# Patient Record
Sex: Female | Born: 1960 | Race: White | Hispanic: No | Marital: Single | Smoking: Never smoker
Health system: Southern US, Community
[De-identification: ages and names within clinical notes are randomized; demographics above are authoritative.]

## PROBLEM LIST (undated history)

## (undated) HISTORY — PX: TUBAL LIGATION: SHX77

## (undated) HISTORY — PX: OTHER SURGICAL HISTORY: SHX169

---

## 2013-01-11 ENCOUNTER — Ambulatory Visit: Payer: Self-pay | Admitting: Obstetrics and Gynecology

## 2013-01-11 LAB — CBC WITH DIFFERENTIAL/PLATELET
Basophil #: 0 10*3/uL (ref 0.0–0.1)
Eosinophil #: 0 10*3/uL (ref 0.0–0.7)
Eosinophil %: 0.1 %
HCT: 40.6 % (ref 35.0–47.0)
Neutrophil #: 10.5 10*3/uL — ABNORMAL HIGH (ref 1.4–6.5)
Neutrophil %: 88 %
Platelet: 191 10*3/uL (ref 150–440)
RBC: 4.56 10*6/uL (ref 3.80–5.20)

## 2013-01-15 LAB — PATHOLOGY REPORT

## 2013-01-23 ENCOUNTER — Ambulatory Visit: Payer: Self-pay | Admitting: Family Medicine

## 2013-02-11 ENCOUNTER — Ambulatory Visit: Payer: Self-pay | Admitting: Family Medicine

## 2013-12-27 ENCOUNTER — Ambulatory Visit: Payer: Self-pay

## 2014-05-16 NOTE — Op Note (Signed)
PATIENT NAMCaryl Bishop:  Ann Bishop, Ann Bishop MR#:  [id number] DATE OF BIRTH:  Jun 10, 1960  DATE OF PROCEDURE:  01/11/2013  PREOPERATIVE DIAGNOSIS:  Prolapsing polyp.   POSTOPERATIVE DIAGNOSIS:  Prolapsing fibroid.   OPERATION PERFORMED:  Operative hysteroscopy and resection of a prolapsing submucosal fibroid.    ANESTHESIA USED:  General via LMA airway.   PRIMARY SURGEON:  Dr. Vena AustriaAndreas Isidro Monks.    PREOPERATIVE ANTIBIOTICS:  None.   DRAINS OR TUBES:  None.   IMPLANTS:  None.   ESTIMATED BLOOD LOSS:  25 mL.   OPERATIVE FLUIDS:  900 mL of crystalloid.   URINE OUTPUT:  150 mL of clear urine.   GLYCINE DEFICIT:  60 mL.   COMPLICATIONS:  None.   INTRAOPERATIVE FINDINGS:  A 2 cm prolapsing fibroid noted at the external cervical os.  The stalk was unable to be twisted off so the fibroid was resected off the stalk using Mayo scissors.  The remainder of the stalk was then removed using a combination of sharp curettage and the resectoscope.   SPECIMENS REMOVED:  Endometrial curettings and prolapsing leiomyomata.    THE PATIENT CONDITION FOLLOWING PROCEDURE:  Stable.   PROCEDURE IN DETAIL:  Risks, benefits and alternatives of the procedure were discussed with the patient prior to proceeding to the Operating Room.  The patient had been seen by her primary care earlier today for a routine annual exam at which time the prolapsing fibroid had been noted.  Attempt had been made to remove the fibroid in the office, however this resulted in bleeding.  The patient was seen in clinic by Dr. Luella Cookosenow and referred for surgical management.  The patient was taken to the operating room, was placed under general endotracheal anesthesia.  She was positioned in the dorsal lithotomy position.  Attention was turned to the patient's pelvic.  The bladder was traight cathed with a red rubber catheter.  An operative speculum was placed and the anterior lip of the cervix was grasped with a single-tooth tenaculum.  A large prolapsing  fibroid was noted at the os.  This appeared slightly necrotic.  Attempts to remove the fibroid by grasping it with a ring forceps and twisting were unsuccessful.  The fibroid was therefore pulled as far out of the cervical os as possible and the stalk resected using Mayo scissors.  Following this, a sharp curettage was performed. Hysteroscopy at the conclusion of the dilation and curettage revealed a portion of the stalk was still noted at the fundal portion of the uterus.  This was resected using the resectoscope with excellent restoration of normal uterine confluent anatomy.  The resection site was hemostatic.  The resectoscope was removed.  Sponge, needle and instrument counts were correct x 2.  The patient tolerated the procedure well and was taken to the recovery room in stable condition.     ____________________________ Florina OuAndreas M. Bonney AidStaebler, MD ams:ea D: 01/11/2013 22:38:00 ET T: 01/12/2013 06:34:59 ET JOB#: [id number]  cc: Florina OuAndreas M. Bonney AidStaebler, MD, <Dictator> Lorrene ReidANDREAS M Kella Splinter MD ELECTRONICALLY SIGNED 01/13/2013 19:08

## 2014-05-16 NOTE — Op Note (Signed)
PATIENT NAME:  Ann Bishop, Ann Bishop MR#:  [id number] DATE OF BIRTH:  12/02/1960  DATE OF PROCEDURE:  01/11/2013  PREOPERATIVE DIAGNOSIS:  Prolapsing polyp.   POSTOPERATIVE DIAGNOSIS:  Prolapsing fibroid.   OPERATION PERFORMED:  Operative hysteroscopy and resection of a prolapsing submucosal fibroid.    ANESTHESIA USED:  General via LMA airway.   PRIMARY SURGEON:  Dr. Steed Kanaan.    PREOPERATIVE ANTIBIOTICS:  None.   DRAINS OR TUBES:  None.   IMPLANTS:  None.   ESTIMATED BLOOD LOSS:  25 mL.   OPERATIVE FLUIDS:  900 mL of crystalloid.   URINE OUTPUT:  150 mL of clear urine.   GLYCINE DEFICIT:  60 mL.   COMPLICATIONS:  None.   INTRAOPERATIVE FINDINGS:  A 2 cm prolapsing fibroid noted at the external cervical os.  The stalk was unable to be twisted off so the fibroid was resected off the stalk using Mayo scissors.  The remainder of the stalk was then removed using a combination of sharp curettage and the resectoscope.   SPECIMENS REMOVED:  Endometrial curettings and prolapsing leiomyomata.    THE PATIENT CONDITION FOLLOWING PROCEDURE:  Stable.   PROCEDURE IN DETAIL:  Risks, benefits and alternatives of the procedure were discussed with the patient prior to proceeding to the Operating Room.  The patient had been seen by her primary care earlier today for a routine annual exam at which time the prolapsing fibroid had been noted.  Attempt had been made to remove the fibroid in the office, however this resulted in bleeding.  The patient was seen in clinic by Dr. Rosenow and referred for surgical management.  The patient was taken to the operating room, was placed under general endotracheal anesthesia.  She was positioned in the dorsal lithotomy position.  Attention was turned to the patient's pelvic.  The bladder was traight cathed with a red rubber catheter.  An operative speculum was placed and the anterior lip of the cervix was grasped with a single-tooth tenaculum.  A large prolapsing  fibroid was noted at the os.  This appeared slightly necrotic.  Attempts to remove the fibroid by grasping it with a ring forceps and twisting were unsuccessful.  The fibroid was therefore pulled as far out of the cervical os as possible and the stalk resected using Mayo scissors.  Following this, a sharp curettage was performed. Hysteroscopy at the conclusion of the dilation and curettage revealed a portion of the stalk was still noted at the fundal portion of the uterus.  This was resected using the resectoscope with excellent restoration of normal uterine confluent anatomy.  The resection site was hemostatic.  The resectoscope was removed.  Sponge, needle and instrument counts were correct x 2.  The patient tolerated the procedure well and was taken to the recovery room in stable condition.     ____________________________ Tanuj Mullens M. Dashea Mcmullan, MD ams:ea D: 01/11/2013 22:38:00 ET T: 01/12/2013 06:34:59 ET JOB#: [id number]  cc: Keisa Blow M. Zahirah Cheslock, MD, <Dictator> Bracen Schum M Aleck Locklin MD ELECTRONICALLY SIGNED 01/13/2013 19:08 

## 2014-12-23 ENCOUNTER — Encounter: Payer: Self-pay | Admitting: Internal Medicine

## 2014-12-23 DIAGNOSIS — D539 Nutritional anemia, unspecified: Secondary | ICD-10-CM | POA: Insufficient documentation

## 2015-06-07 IMAGING — MG MAM DGTL SCRN MAM NO ORDER W/CAD
1 series · 4 of 4 positions shown · non-contrast
Comparison: Previous exam(s)

CLINICAL DATA: Screening.

EXAM:
DIGITAL SCREENING BILATERAL MAMMOGRAM WITH CAD

[R CC · right · 4 of 4 slices shown]
[im 1/4]
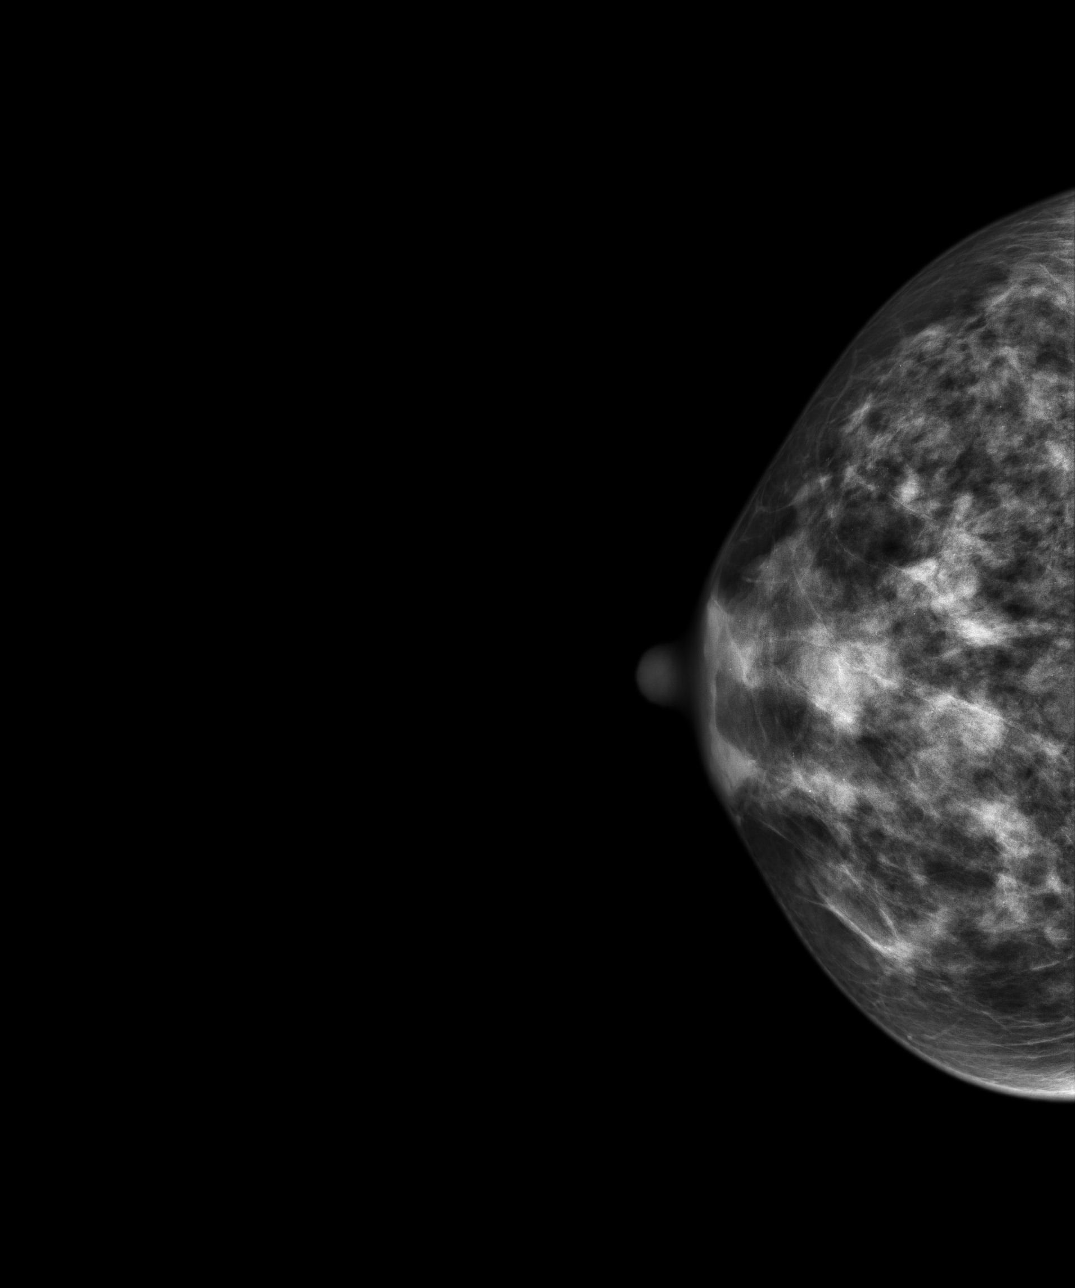
[im 2/4]
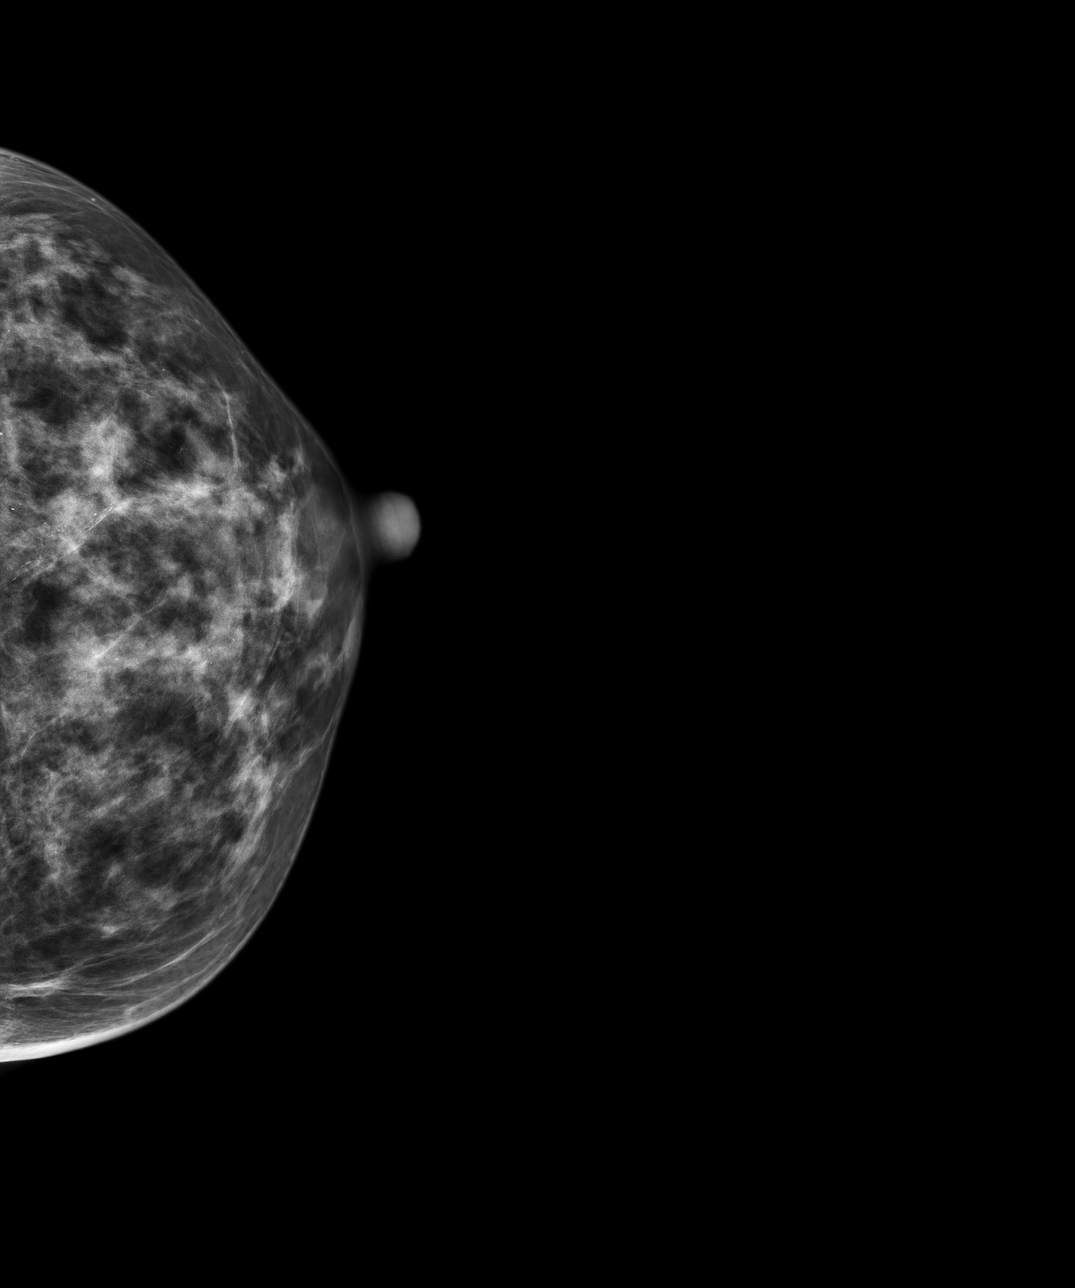
[im 3/4]
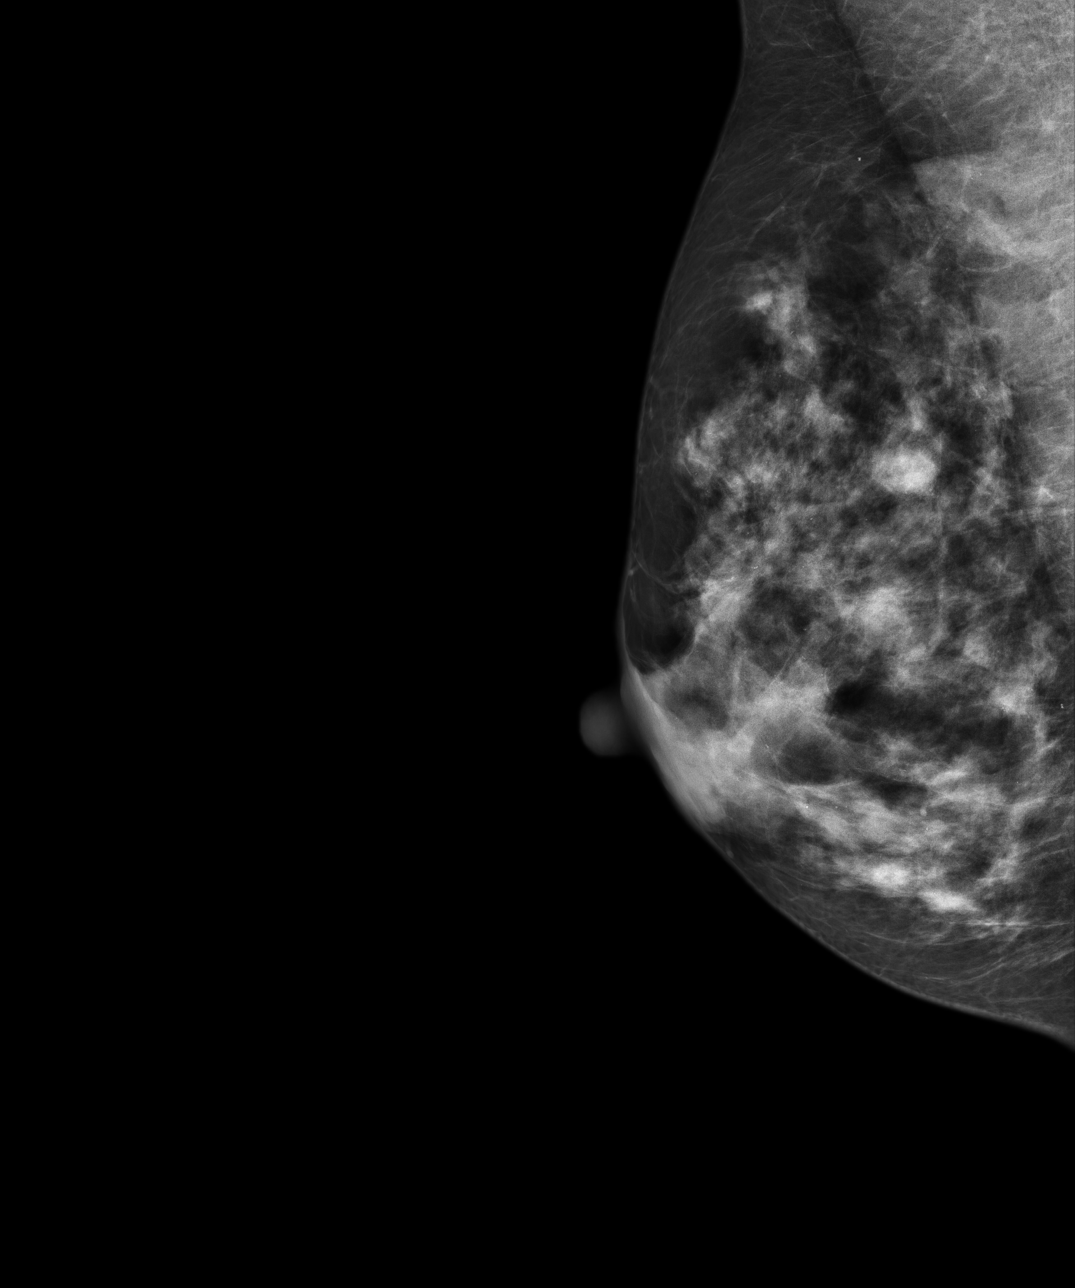
[im 4/4]
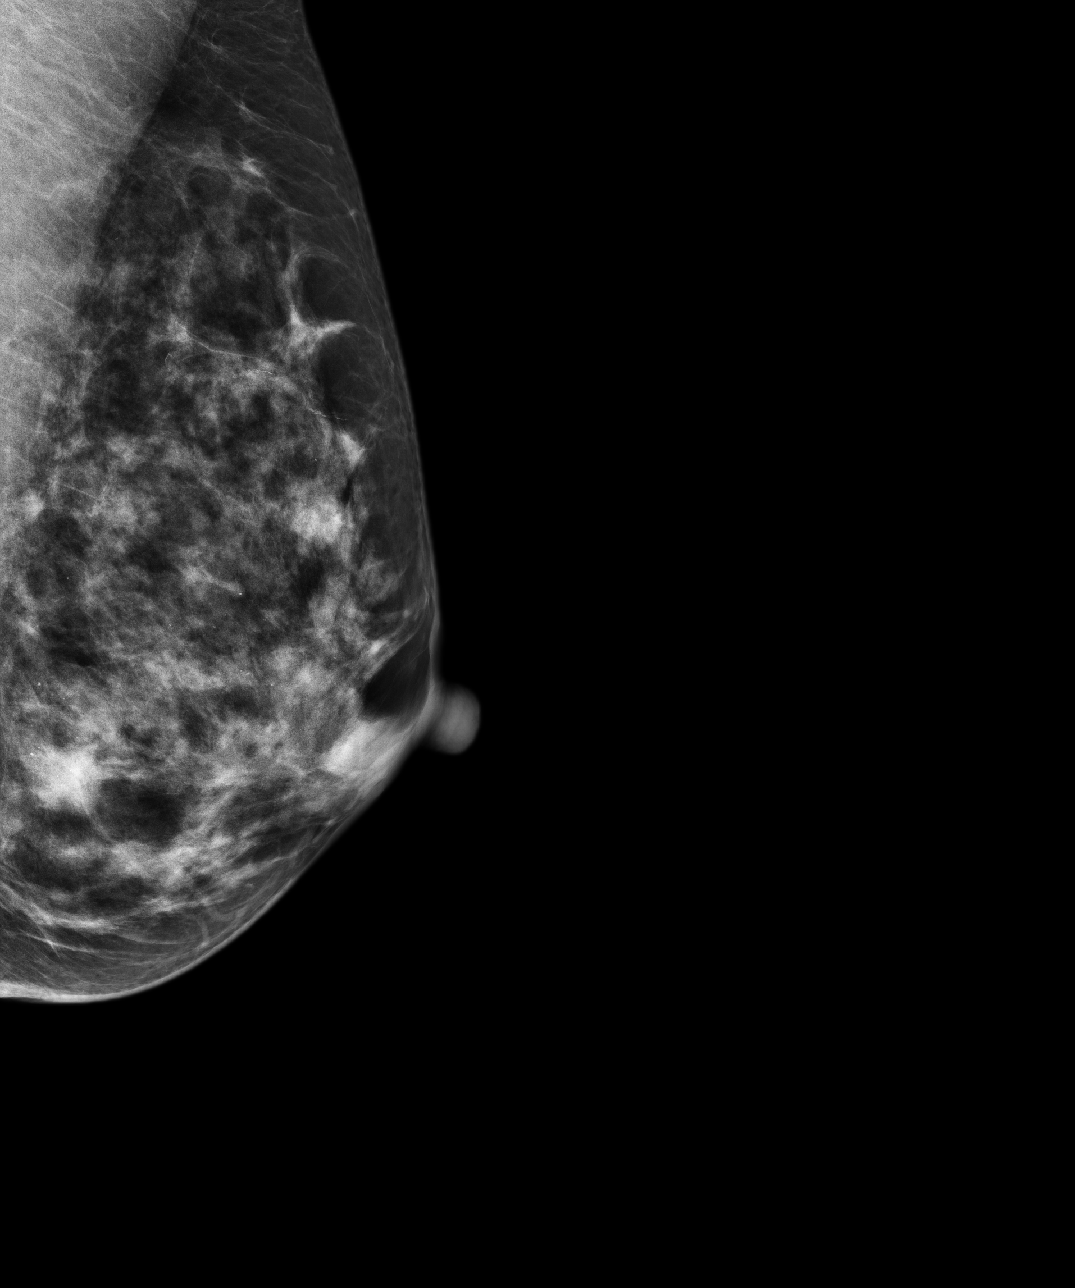

[4 of 4 positions shown; findings below may reference images not displayed]

ACR Breast Density Category c: The breasts are heterogeneously
dense, which may obscure small masses.
FINDINGS: In the left breast, a possible mass warrants further evaluation with
spot compression views and possibly ultrasound. In the right breast,
no masses or malignant type calcifications are identified. Images
were processed with CAD.
IMPRESSION: Further evaluation is suggested for possible mass in the left
breast.

RECOMMENDATION:
Diagnostic mammogram and possibly ultrasound of the left breast.
(Code:[id number]-UU1)

The patient will be contacted regarding the findings, and additional
imaging will be scheduled.

BI-RADS CATEGORY  0: Incomplete. Need additional imaging evaluation
and/or prior mammograms for comparison.

## 2016-05-10 IMAGING — MG MMM DGT SCR NO ORDER W/CAD
1 series · 4 of 4 positions shown · non-contrast
Comparison: 01/23/2013, 01/21/2011 and 01/19/2010

CLINICAL DATA: Screening.

EXAM:
DIGITAL SCREENING BILATERAL MAMMOGRAM WITH CAD

[R CC · right · 4 of 4 slices shown]
[im 1/4]
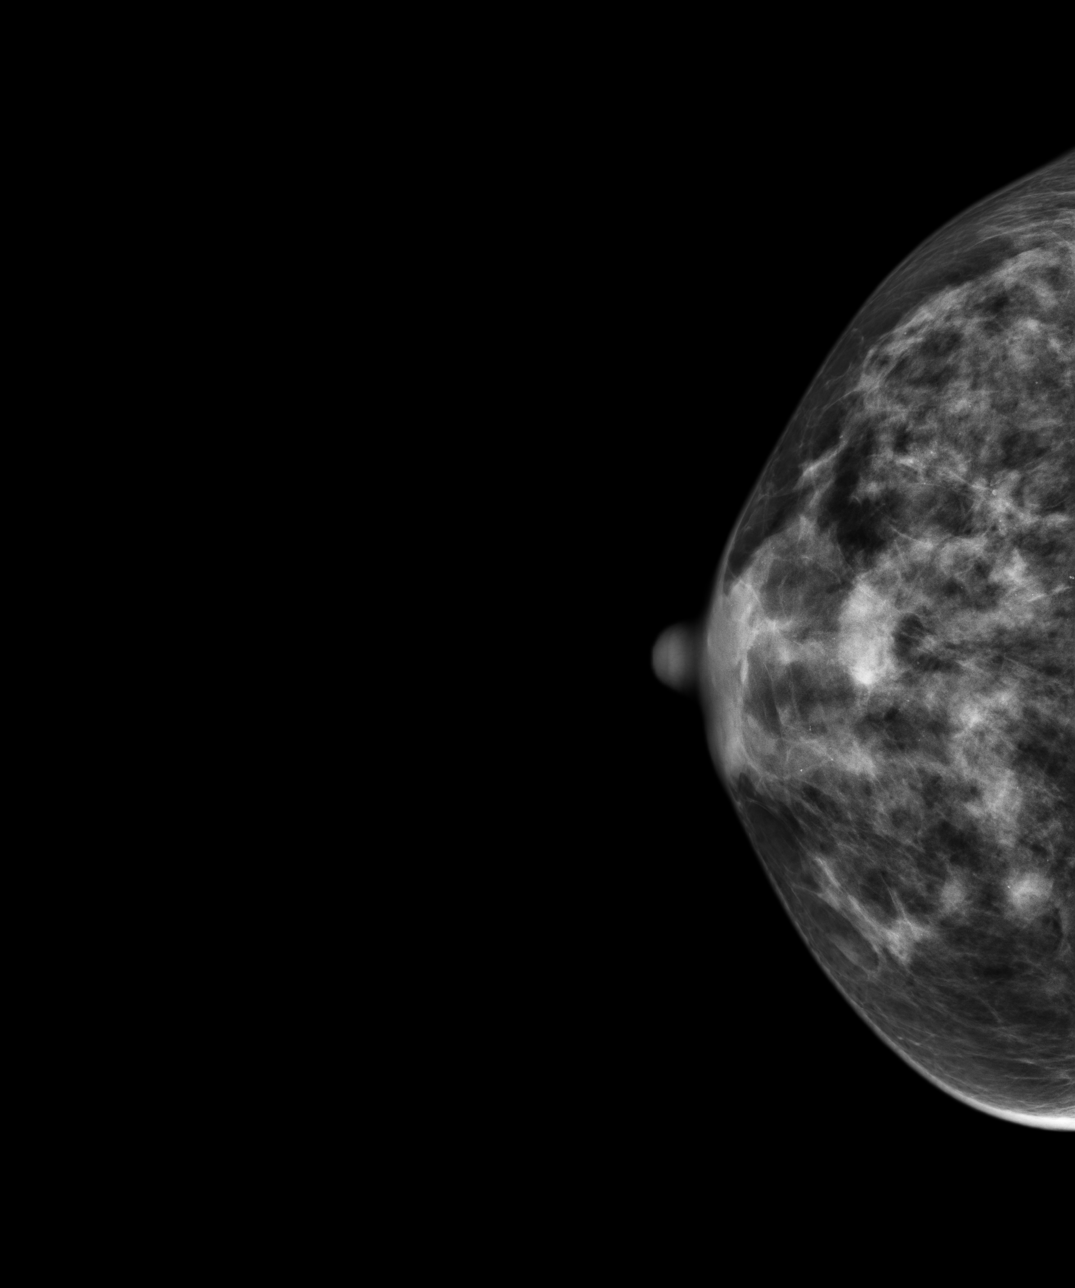
[im 2/4]
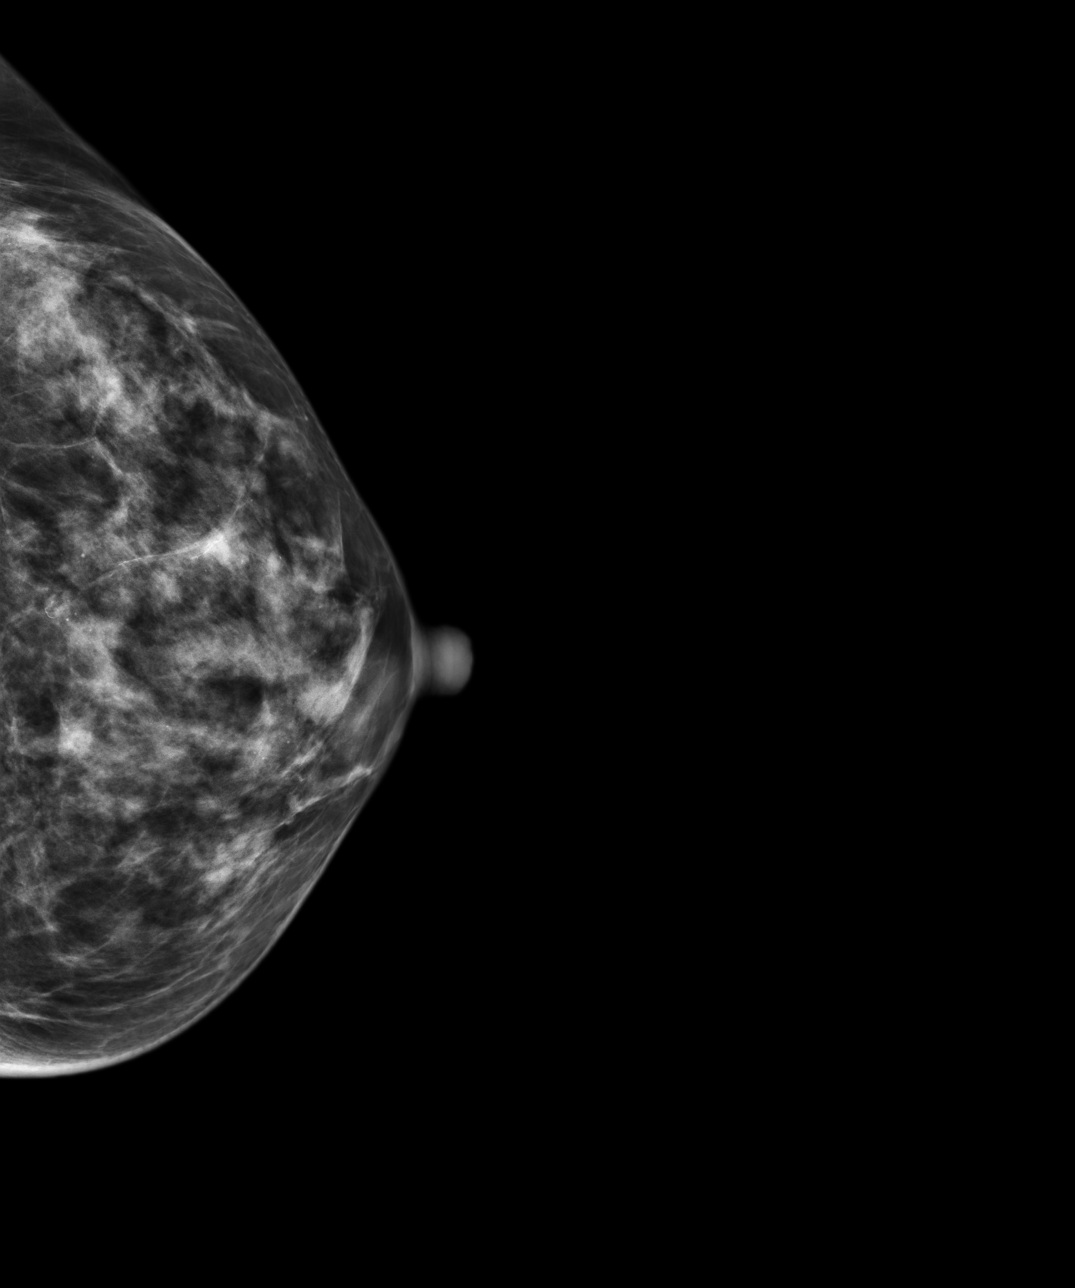
[im 3/4]
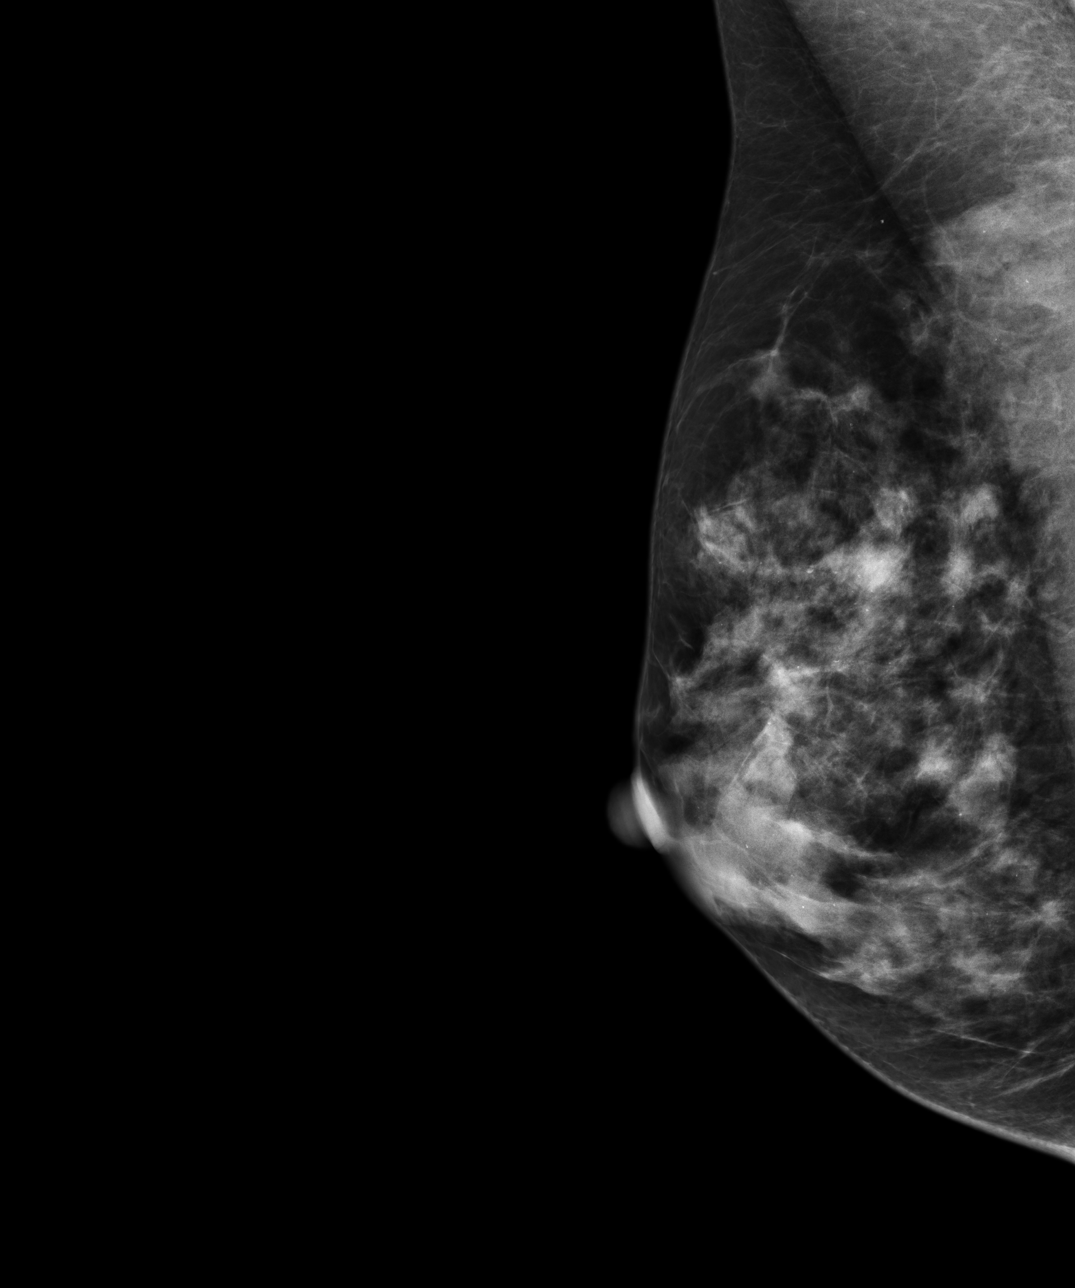
[im 4/4]
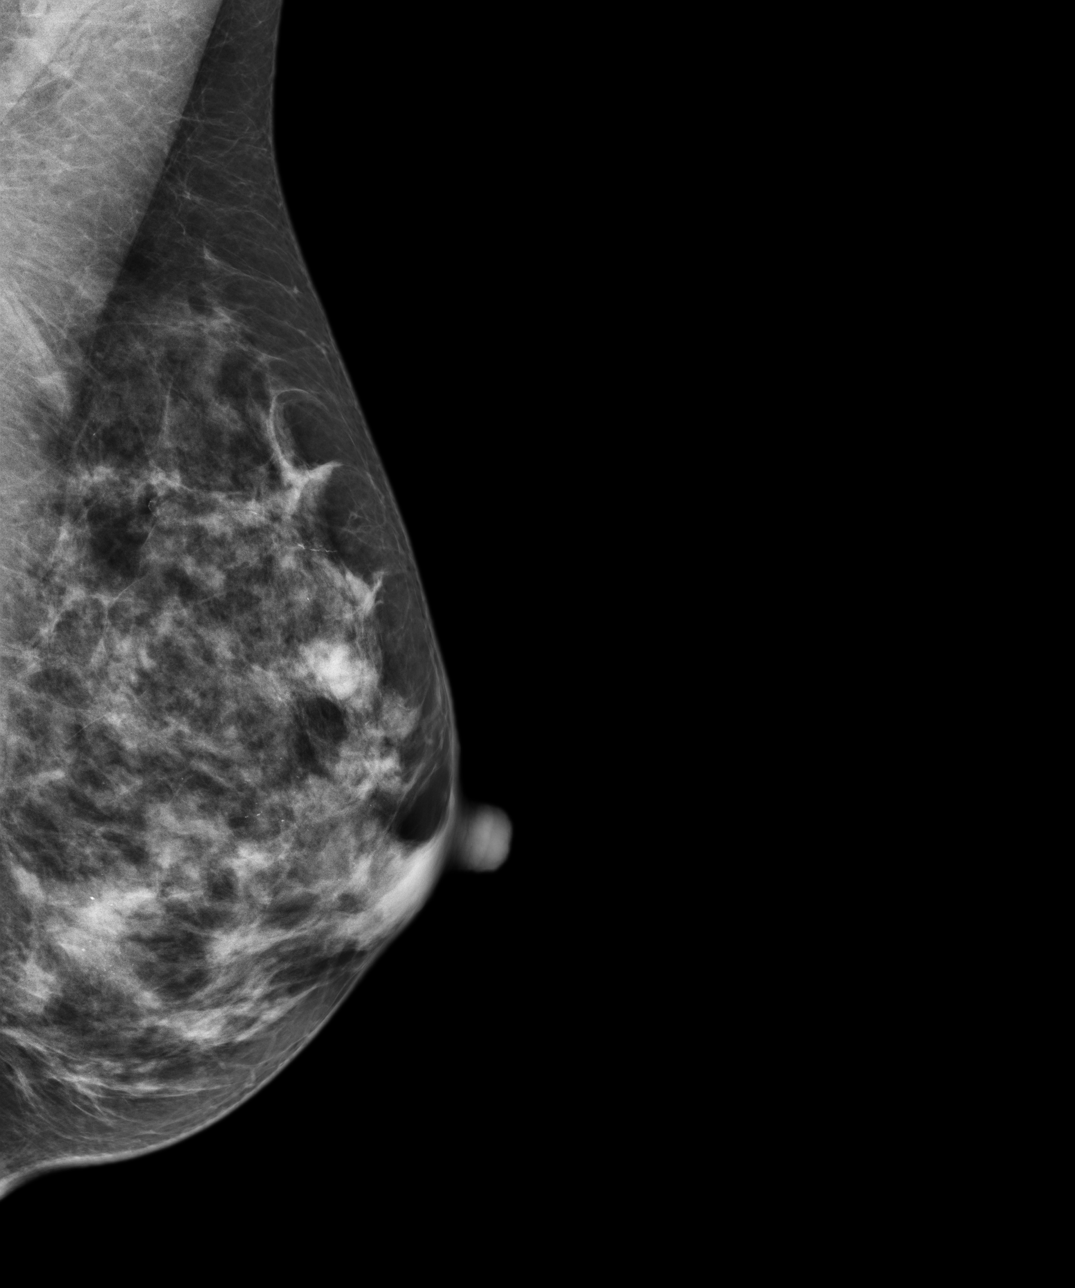

[4 of 4 positions shown; findings below may reference images not displayed]

ACR Breast Density Category c: The breast tissue is heterogeneously
dense, which may obscure small masses.
FINDINGS: There are no findings suspicious for malignancy. Images were
processed with CAD.
IMPRESSION: No mammographic evidence of malignancy. A result letter of this
screening mammogram will be mailed directly to the patient.

RECOMMENDATION:
Screening mammogram in one year. (Code:[id number])

BI-RADS CATEGORY  1: Negative.
